# Patient Record
Sex: Male | Born: 1985 | Race: White | Hispanic: No | Marital: Single | Smoking: Current every day smoker
Health system: Southern US, Community
[De-identification: ages and names within clinical notes are randomized; demographics above are authoritative.]

## PROBLEM LIST (undated history)

## (undated) DIAGNOSIS — L659 Nonscarring hair loss, unspecified: Secondary | ICD-10-CM

## (undated) DIAGNOSIS — F410 Panic disorder [episodic paroxysmal anxiety] without agoraphobia: Secondary | ICD-10-CM

## (undated) DIAGNOSIS — Z720 Tobacco use: Secondary | ICD-10-CM

## (undated) DIAGNOSIS — F429 Obsessive-compulsive disorder, unspecified: Secondary | ICD-10-CM

## (undated) DIAGNOSIS — R5381 Other malaise: Secondary | ICD-10-CM

## (undated) DIAGNOSIS — Z7251 High risk heterosexual behavior: Secondary | ICD-10-CM

## (undated) DIAGNOSIS — F209 Schizophrenia, unspecified: Secondary | ICD-10-CM

## (undated) DIAGNOSIS — F17200 Nicotine dependence, unspecified, uncomplicated: Secondary | ICD-10-CM

## (undated) DIAGNOSIS — K219 Gastro-esophageal reflux disease without esophagitis: Secondary | ICD-10-CM

## (undated) DIAGNOSIS — K589 Irritable bowel syndrome without diarrhea: Secondary | ICD-10-CM

## (undated) DIAGNOSIS — L700 Acne vulgaris: Secondary | ICD-10-CM

## (undated) DIAGNOSIS — M216X9 Other acquired deformities of unspecified foot: Secondary | ICD-10-CM

## (undated) DIAGNOSIS — F988 Other specified behavioral and emotional disorders with onset usually occurring in childhood and adolescence: Secondary | ICD-10-CM

## (undated) DIAGNOSIS — R5383 Other fatigue: Secondary | ICD-10-CM

## (undated) HISTORY — DX: Tobacco use: Z72.0

## (undated) HISTORY — DX: Schizophrenia, unspecified: F20.9

## (undated) HISTORY — DX: Nonscarring hair loss, unspecified: L65.9

## (undated) HISTORY — DX: Irritable bowel syndrome without diarrhea: K58.9

## (undated) HISTORY — DX: Panic disorder (episodic paroxysmal anxiety): F41.0

## (undated) HISTORY — DX: Obsessive-compulsive disorder, unspecified: F42.9

## (undated) HISTORY — PX: HAND SURGERY: SHX662

## (undated) HISTORY — DX: Acne vulgaris: L70.0

## (undated) HISTORY — DX: Other fatigue: R53.83

## (undated) HISTORY — DX: Nicotine dependence, unspecified, uncomplicated: F17.200

## (undated) HISTORY — DX: Gastro-esophageal reflux disease without esophagitis: K21.9

## (undated) HISTORY — DX: Other malaise: R53.81

## (undated) HISTORY — PX: TOOTH EXTRACTION: SUR596

## (undated) HISTORY — DX: High risk heterosexual behavior: Z72.51

## (undated) HISTORY — DX: Other specified behavioral and emotional disorders with onset usually occurring in childhood and adolescence: F98.8

## (undated) HISTORY — DX: Other acquired deformities of unspecified foot: M21.6X9

## (undated) HISTORY — PX: WISDOM TOOTH EXTRACTION: SHX21

---

## 2004-11-22 ENCOUNTER — Inpatient Hospital Stay (HOSPITAL_COMMUNITY): Admission: EM | Admit: 2004-11-22 | Discharge: 2004-12-01 | Payer: Self-pay | Admitting: Emergency Medicine

## 2004-11-30 ENCOUNTER — Ambulatory Visit: Payer: Self-pay | Admitting: Infectious Diseases

## 2005-11-21 ENCOUNTER — Encounter: Admission: RE | Admit: 2005-11-21 | Discharge: 2005-11-21 | Payer: Self-pay | Admitting: Orthopedic Surgery
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
## 2006-03-29 IMAGING — CR DG CHEST 2V
2 series · 2 of 2 positions shown · non-contrast
Comparison: none

CLINICAL DATA: ________________________
 CHEST ? 2 VIEW:
 PA and lateral views of the chest are made and are compared to previous studies of 11/29/04 and show mild blunting of the right costophrenic angle.   No definite pneumothorax is seen.   The heart and mediastinum appear normal.   The lungs show mild diffuse peribronchial thickening.

[w chest pa]
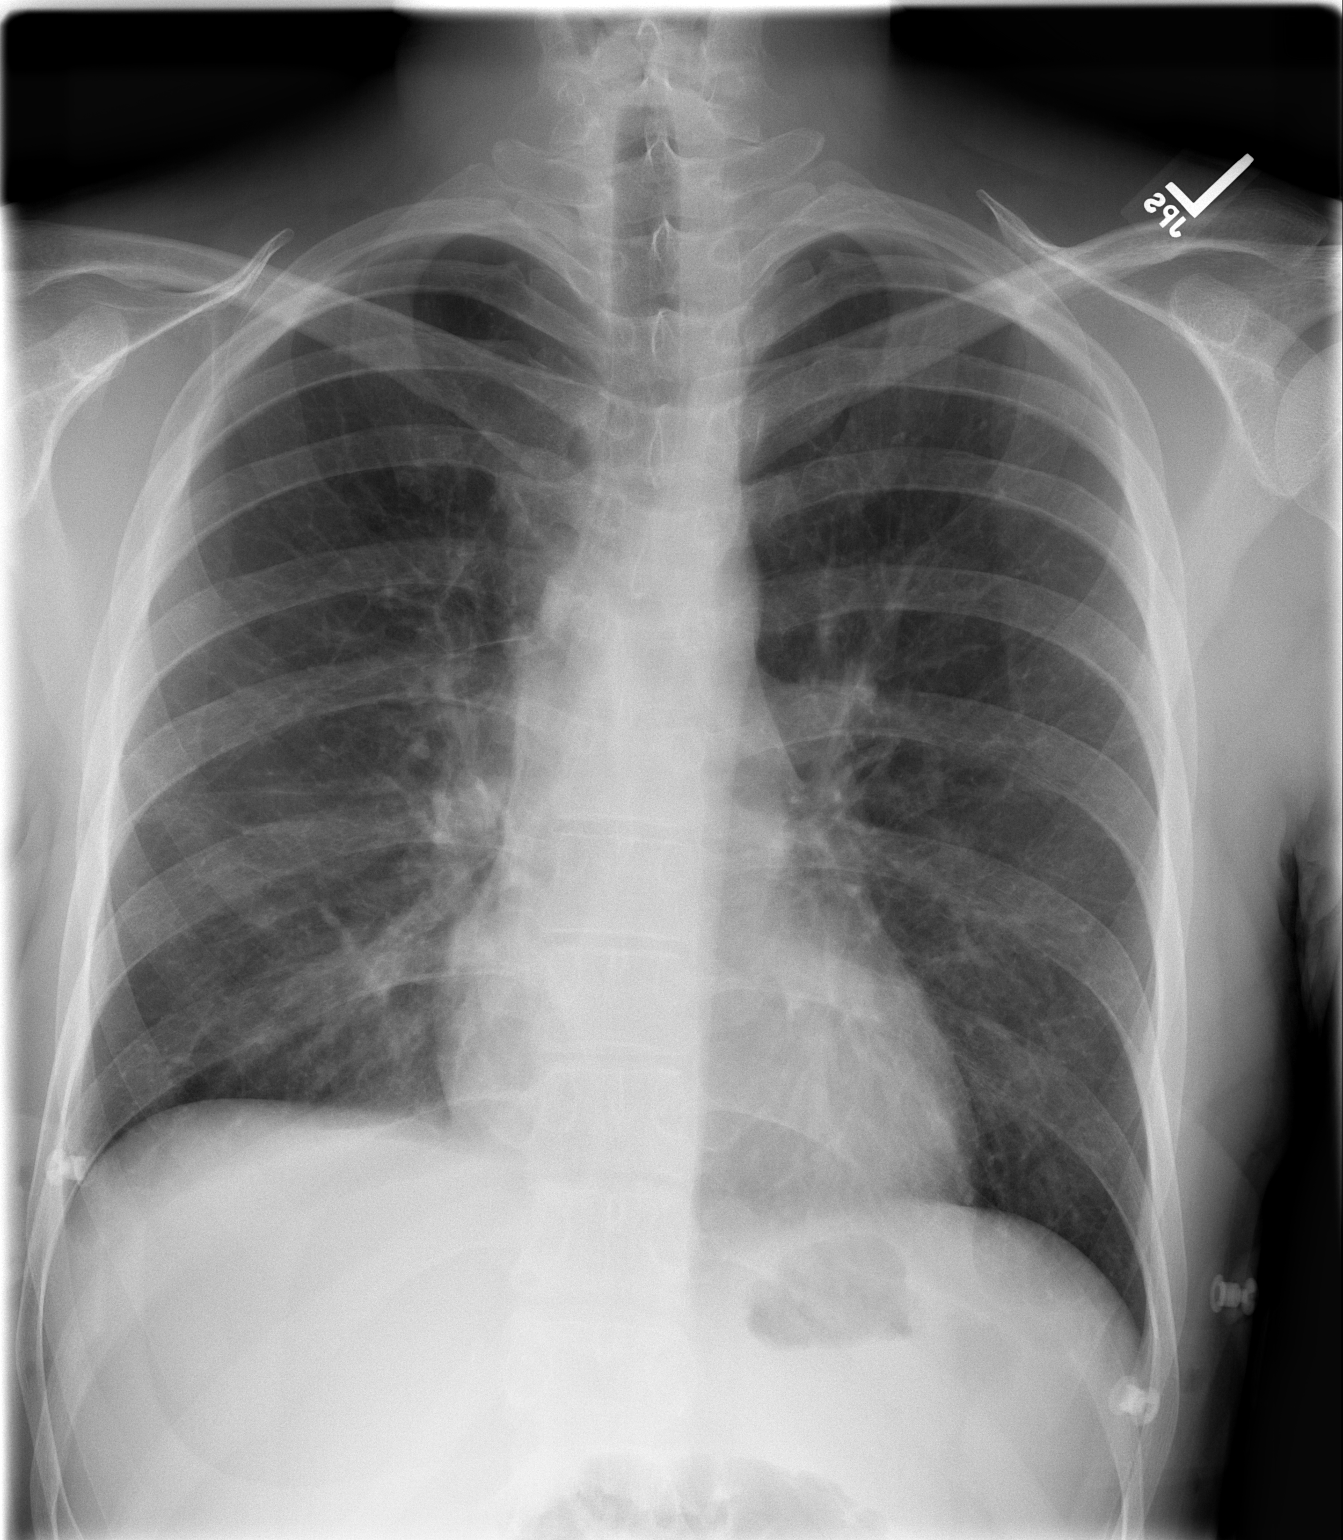

[w chest lat]
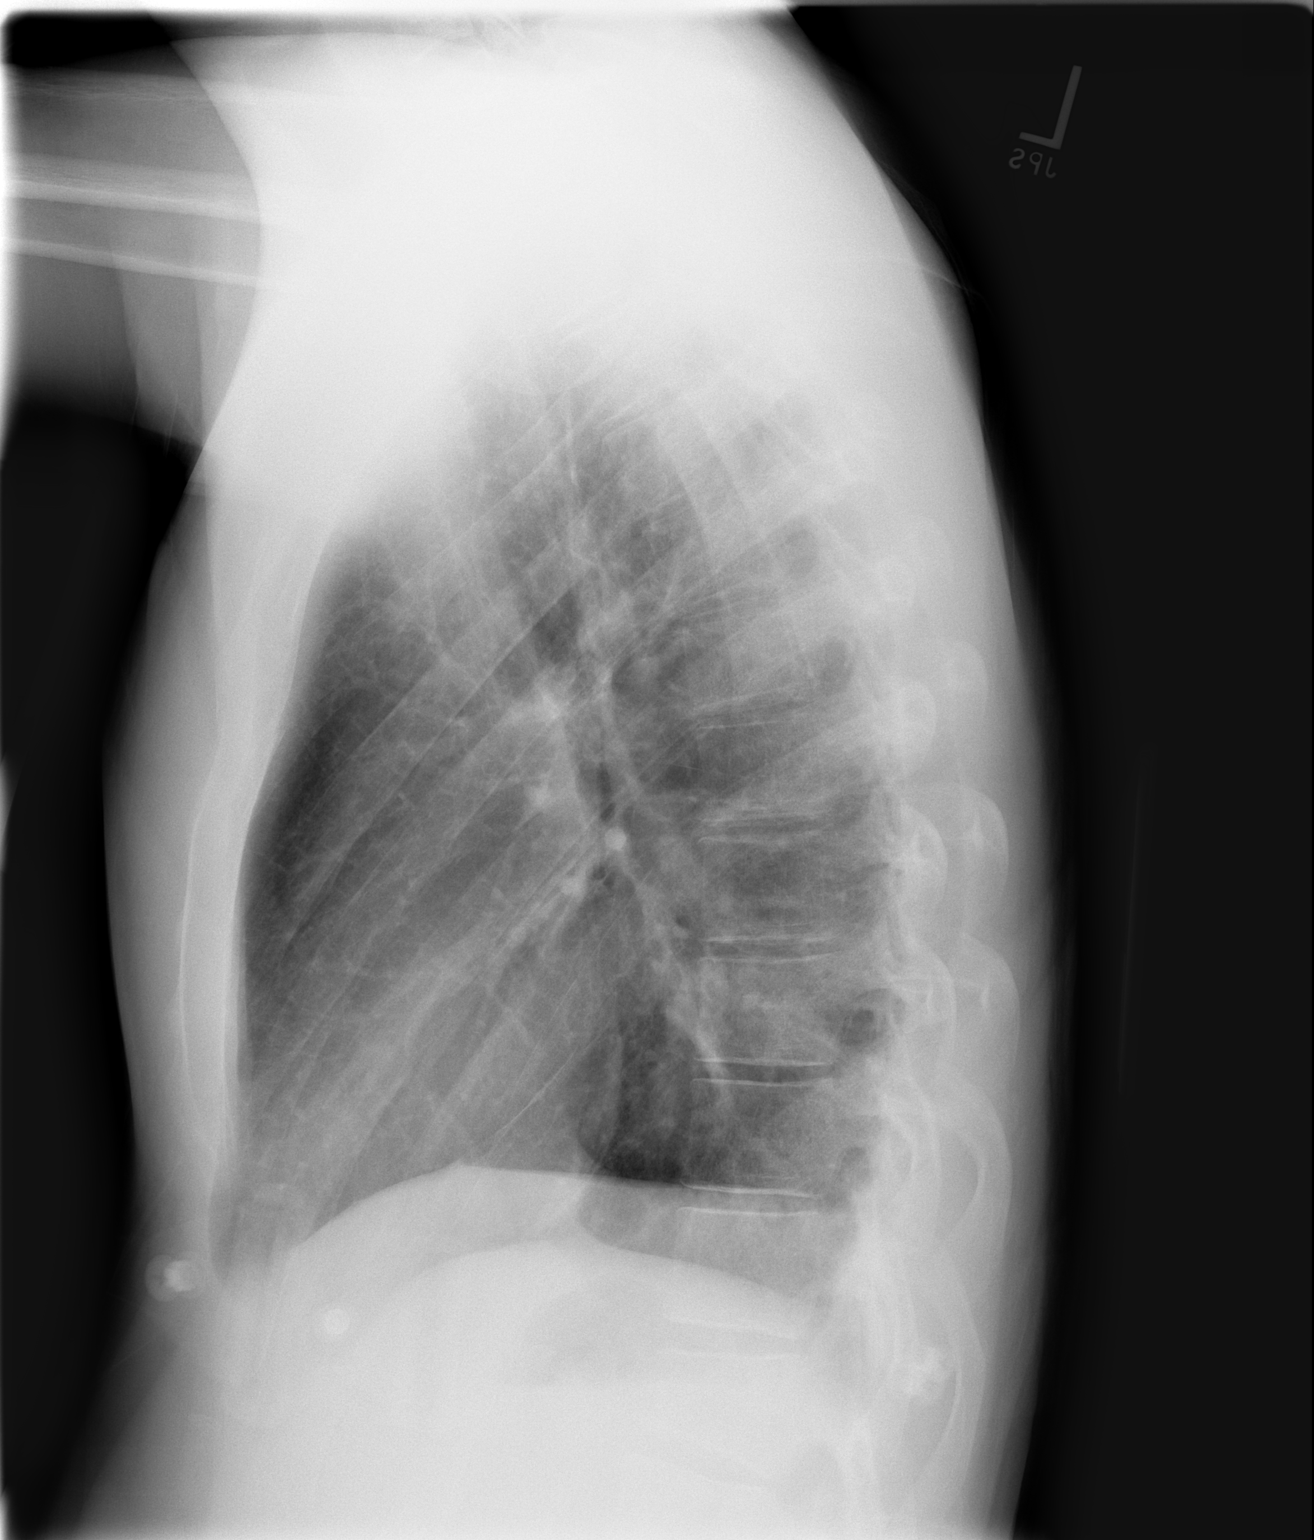

[2 of 2 positions shown; findings below may reference images not displayed]

IMPRESSION: No evidence of active disease or significant interval change.   There remains some blunting of the right costophrenic angle.   There is no pneumothorax.
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
## 2007-09-02 ENCOUNTER — Encounter: Admission: RE | Admit: 2007-09-02 | Discharge: 2007-09-02 | Payer: Self-pay | Admitting: Orthopedic Surgery
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
## 2010-10-21 NOTE — Discharge Summary (Signed)
NAMEKALEB, SEK NO.:  [id number]   MEDICAL RECORD NO.:  [id number]          PATIENT TYPE:  INP   LOCATION:  5741                         FACILITY:  [facility]   PHYSICIAN:  Earney Hamburg, P.A.  DATE OF BIRTH:  [birth year]   DATE OF ADMISSION:  11/22/2004  DATE OF DISCHARGE:                                 DISCHARGE SUMMARY   Audio too short to transcribe (less than 5 seconds)       MJ/MEDQ  D:  12/01/2004  T:  12/01/2004  Job:  [id number]

## 2015-03-31 ENCOUNTER — Ambulatory Visit: Payer: Self-pay | Admitting: Sports Medicine

## 2015-11-10 ENCOUNTER — Encounter: Payer: Self-pay | Admitting: Gastroenterology

## 2015-12-03 ENCOUNTER — Ambulatory Visit: Payer: Self-pay | Admitting: Allergy and Immunology

## 2016-01-06 ENCOUNTER — Ambulatory Visit: Payer: Self-pay | Admitting: Allergy and Immunology

## 2016-01-07 ENCOUNTER — Other Ambulatory Visit: Payer: Self-pay

## 2016-01-07 ENCOUNTER — Ambulatory Visit: Payer: Self-pay | Admitting: Gastroenterology

## 2016-01-20 ENCOUNTER — Ambulatory Visit (INDEPENDENT_AMBULATORY_CARE_PROVIDER_SITE_OTHER): Payer: BLUE CROSS/BLUE SHIELD | Admitting: Allergy and Immunology

## 2016-01-20 ENCOUNTER — Encounter: Payer: Self-pay | Admitting: Allergy and Immunology

## 2016-01-20 VITALS — BP 152/88 | HR 84 | Temp 99.2°F | Resp 18 | Ht 70.0 in | Wt 165.0 lb

## 2016-01-20 DIAGNOSIS — L700 Acne vulgaris: Secondary | ICD-10-CM | POA: Diagnosis not present

## 2016-01-20 DIAGNOSIS — L308 Other specified dermatitis: Secondary | ICD-10-CM | POA: Diagnosis not present

## 2016-01-20 DIAGNOSIS — J309 Allergic rhinitis, unspecified: Secondary | ICD-10-CM | POA: Diagnosis not present

## 2016-01-20 DIAGNOSIS — H1010 Acute atopic conjunctivitis, unspecified eye: Secondary | ICD-10-CM | POA: Diagnosis not present

## 2016-01-20 DIAGNOSIS — L989 Disorder of the skin and subcutaneous tissue, unspecified: Secondary | ICD-10-CM

## 2016-01-20 NOTE — Progress Notes (Signed)
NEW PATIENT NOTE  Referring Provider: No ref. provider found Primary Provider: Feliciana RossettiGRISSO,GREG, MD Date of office visit: 01/20/2016    Subjective:   Chief Complaint:  Oscar Banks (DOB: 01/21/1986) is a 30 y.o. male with a chief complaint of Allergic Reaction (rash from medication)  who presents to the clinic on 01/20/2016 with the following problems:  HPI: Oscar Banks presents to this clinic in evaluation of a reaction to isotretinoin. Apparently 2 weeks after starting isotretinoin in April 2017 he developed a red blotchy nonpruritic dermatitis across his body at which time he stopped his isotretinoin and received a systemic steroid within 2 days. His reaction lasted a few more days and completely resolved and he never had any associated systemic or constitutional symptoms. He never developed any blistering or ulcerations or mucosal lesions. He is very interested in restarting this medication because of his acne. He's been on isotretinoin several times in the past and has never had any problem with this medication. His last use of this medication was one year ago prior to his use in April. There was no other obvious provoking factor giving rise to this reaction however, he was using Augmentin for pansinusitis starting 10 days prior to this reaction. However, he is using amoxicillin at this point in time for an aspiration pneumonia secondary to a heroin overdose apparently without any obvious dermatitis. As well, he was using Lamictal at the same time that he was using the isotretinoin and this combination was relatively new for him. He was using Lamictal for about one year prior to taking his isotretinoin. He discontinued his Lamictal a month or 2 after his reaction.  Oscar Banks does have an atopic history especially with the development of what sounds like urticaria and itchy red watery eyes and nasal congestion and sneezing during childhood when being exposed to cats and dogs. This is still  slightly active but is not a problem that requires him to use any type of antihistamine or other medications.  Past Medical History:  Diagnosis Date  . Acne vulgaris   . ADD (attention deficit disorder)   . GERD (gastroesophageal reflux disease)   . Hair loss   . High risk sexual behavior   . IBS (irritable bowel syndrome)   . Malaise and fatigue   . OCD (obsessive compulsive disorder)   . Panic disorder   . Pes equinus, acquired   . Schizophrenia (HCC)   . Tobacco abuse   . Tobacco dependence     Past Surgical History:  Procedure Laterality Date  . HAND SURGERY     knuckle surgery  . TOOTH EXTRACTION    . WISDOM TOOTH EXTRACTION        Medication List      amoxicillin 500 MG capsule Commonly known as:  AMOXIL Take 500 mg by mouth 3 (three) times daily.   amphetamine-dextroamphetamine 20 MG tablet Commonly known as:  ADDERALL Take 20 mg by mouth 2 (two) times daily.   buPROPion 300 MG 24 hr tablet Commonly known as:  WELLBUTRIN XL Take 300 mg by mouth daily.   clonazePAM 1 MG tablet Commonly known as:  KLONOPIN Take 1 mg by mouth 3 (three) times daily.   lamoTRIgine 100 MG tablet Commonly known as:  LAMICTAL Take 100 mg by mouth daily.   mometasone 50 MCG/ACT nasal spray Commonly known as:  NASONEX Place 2 sprays into the nose daily.   predniSONE 10 MG tablet Commonly known as:  DELTASONE Take 10  mg by mouth daily.   QUEtiapine 200 MG tablet Commonly known as:  SEROQUEL Take 300 mg by mouth at bedtime.       Allergies  Allergen Reactions  . Erythromycin Base Other (See Comments)    Unknown  . Isotretinoin Rash  . Paxil [Paroxetine Hcl] Other (See Comments)    unknown    Review of systems negative except as noted in HPI / PMHx or noted below:  Review of Systems  Constitutional: Negative.   HENT: Negative.   Eyes: Negative.   Respiratory: Negative.        Recently admitted to [facility] for aspiration pneumonia as a result of  heroin overdose. Presently completing course of antibiotics and steroids.  Cardiovascular: Negative.   Gastrointestinal: Negative.   Genitourinary: Negative.   Musculoskeletal: Negative.   Skin: Negative.   Neurological: Negative.   Endo/Heme/Allergies: Negative.   Psychiatric/Behavioral: Negative.     Family History  Problem Relation Age of Onset  . Sleep apnea Father   . Hypertension Father     Social History   Social History  . Marital status: Single    Spouse name: N/A  . Number of children: N/A  . Years of education: N/A   Occupational History  . Not on file.   Social History Main Topics  . Smoking status: Current Every Day Smoker    Packs/day: 1.00    Types: Cigarettes  . Smokeless tobacco: Never Used  . Alcohol use No  . Drug use: Unknown  . Sexual activity: Not on file   Other Topics Concern  . Not on file   Social History Narrative  . No narrative on file    Environmental and Social history  Lives in a house with a dry environment, a dog located inside the household, hardwood in the bedroom, no plastic on the bed or pillow, and actively smoking tobacco products.   Objective:   Vitals:   01/20/16 0929  BP: (!) 152/88  Pulse: 84  Resp: 18  Temp: 99.2 F (37.3 C)   Height: 5\' 10"  (177.8 cm) Weight: 165 lb (74.8 kg)  Physical Exam  Constitutional: He is well-developed, well-nourished, and in no distress.  HENT:  Head: Normocephalic.  Right Ear: Tympanic membrane, external ear and ear canal normal.  Left Ear: Tympanic membrane, external ear and ear canal normal.  Nose: Nose normal. No mucosal edema or rhinorrhea.  Mouth/Throat: Uvula is midline, oropharynx is clear and moist and mucous membranes are normal. No oropharyngeal exudate.  Eyes: Conjunctivae are normal.  Neck: Trachea normal. No tracheal tenderness present. No tracheal deviation present. No thyromegaly present.  Cardiovascular: Normal rate, regular rhythm, S1 normal, S2 normal and  normal heart sounds.   No murmur heard. Pulmonary/Chest: Breath sounds normal. No stridor. No respiratory distress. He has no wheezes. He has no rales.  Musculoskeletal: He exhibits no edema.  Lymphadenopathy:       Head (right side): No tonsillar adenopathy present.       Head (left side): No tonsillar adenopathy present.    He has no cervical adenopathy.  Neurological: He is alert. Gait normal.  Skin: Rash (Acne involving face and upper chest.) noted. He is not diaphoretic. No erythema. Nails show no clubbing.  Psychiatric: Mood and affect normal.     Diagnostics:  None   Assessment and Plan:    1. Acne vulgaris   2. Inflammatory dermatosis   3. Allergic rhinoconjunctivitis     1. Complete current antibiotic and steroid  2. Two weeks after completing antibiotic and steroid may start isotretinoin administration utilizing the following protocol:   A. isotretinoin 1 mg one time a day for 10 days, then  B. Isotretinoin 5mg  1 time a day for 10 days, then  C. Isotretinoin 10mg  1 time a day for 10 days, then  D. Isotretinoin 15mg  1 time a day for 10 days, then  E. Isotretinoin 20mg  one time a day for 10 days, then  F. Isotretinoin 25mg  1 time a day for 10 days, then  G. Isotretinoin 30 mg daily  3. Contact clinic immediately if allergic reaction  4. Prednisone 10 mg 10 tablet pack located at home  5. Return to clinic when reaching isotretinoin 30 mg daily or earlier if problem  It is somewhat confusing about what caused the reaction on Stanislaus's skin back in May. Certainly this could have been a reaction to isotretinoin and I'm going to have him undergo an incremental challenge as noted above to address this issue. He will have prednisone at home and at the first sign of any type dermatitis will have him discontinue isotretinoin and start prednisone. I think it is quite possible that his reaction may have been a manifestation of his infection back in May or may be a manifestation  of using Augmentin with a reaction directed against clavulanic acid rather than the amoxicillin component of this medication. I did warn him about the possibility of developing a very significant dermatitis utilizing this plan but he has such significant acne that he would like to go through with this plan at this point in time.  Laurette Schimke, MD  [facility]

## 2016-01-20 NOTE — Progress Notes (Signed)
Called [employer] will need to contact supplier to find out if formula to compound Isotretinoin.

## 2016-01-20 NOTE — Patient Instructions (Addendum)
  1. Complete antibiotic and steroid  2. Two weeks after completing antibiotic and steroid may start isotretinoin administration utilizing the following protocol:   A. isotretinoin 1 mg one time a day for 10 days, then  B. Isotretinoin 5mg  1 time a day for 10 days, then  C. Isotretinoin 10mg  1 time a day for 10 days, then  D. Isotretinoin 15mg  1 time a day for 10 days, then  E. Isotretinoin 20mg  one time a day for 10 days, then  F. Isotretinoin 25mg  1 time a day for 10 days, then  G. Isotretinoin 30 mg daily  3. Contact clinic immediately if allergic reaction  4. Prednisone 10 mg 10 tablet pack located at home  5. Return to clinic when reaching isotretinoin 30 mg daily or earlier if problem

## 2016-04-04 ENCOUNTER — Ambulatory Visit: Payer: Self-pay | Admitting: Gastroenterology

## 2016-04-04 ENCOUNTER — Other Ambulatory Visit: Payer: Self-pay

## 2016-07-06 DEATH — deceased
